# Patient Record
Sex: Female | Born: 1970 | Race: White | Hispanic: No | Marital: Married | Smoking: Never smoker
Health system: Southern US, Community
[De-identification: ages and names within clinical notes are randomized; demographics above are authoritative.]

---

## 2004-10-08 ENCOUNTER — Emergency Department: Payer: Self-pay | Admitting: Emergency Medicine

## 2006-03-23 ENCOUNTER — Inpatient Hospital Stay (HOSPITAL_COMMUNITY): Admission: RE | Admit: 2006-03-23 | Discharge: 2006-03-25 | Payer: Self-pay | Admitting: Obstetrics and Gynecology

## 2010-05-28 ENCOUNTER — Other Ambulatory Visit: Admission: RE | Admit: 2010-05-28 | Discharge: 2010-05-28 | Payer: Self-pay | Admitting: Obstetrics and Gynecology

## 2010-08-22 ENCOUNTER — Encounter: Payer: Self-pay | Admitting: Obstetrics & Gynecology

## 2010-11-24 ENCOUNTER — Inpatient Hospital Stay (HOSPITAL_COMMUNITY)
Admission: RE | Admit: 2010-11-24 | Discharge: 2010-11-26 | DRG: 765 | Disposition: A | Discharge status: Home / Self Care | Payer: Medicaid Other | Source: Ambulatory Visit | Attending: Obstetrics and Gynecology | Admitting: Obstetrics and Gynecology

## 2010-11-24 ENCOUNTER — Other Ambulatory Visit: Payer: Self-pay | Admitting: Obstetrics and Gynecology

## 2010-11-24 ENCOUNTER — Inpatient Hospital Stay (HOSPITAL_COMMUNITY)
Admission: RE | Admit: 2010-11-24 | Payer: Medicaid Other | Source: Ambulatory Visit | Admitting: Obstetrics and Gynecology

## 2010-11-24 DIAGNOSIS — IMO0002 Reserved for concepts with insufficient information to code with codable children: Secondary | ICD-10-CM

## 2010-11-24 DIAGNOSIS — O09529 Supervision of elderly multigravida, unspecified trimester: Secondary | ICD-10-CM

## 2010-11-24 DIAGNOSIS — O99892 Other specified diseases and conditions complicating childbirth: Secondary | ICD-10-CM | POA: Diagnosis present

## 2010-11-24 DIAGNOSIS — N9489 Other specified conditions associated with female genital organs and menstrual cycle: Secondary | ICD-10-CM | POA: Diagnosis present

## 2010-11-24 DIAGNOSIS — Z302 Encounter for sterilization: Secondary | ICD-10-CM

## 2010-11-24 DIAGNOSIS — O0940 Supervision of pregnancy with grand multiparity, unspecified trimester: Secondary | ICD-10-CM

## 2010-11-24 DIAGNOSIS — O34219 Maternal care for unspecified type scar from previous cesarean delivery: Secondary | ICD-10-CM

## 2010-11-24 LAB — URINE MICROSCOPIC-ADD ON

## 2010-11-24 LAB — CBC
MCH: 32.4 pg (ref 26.0–34.0)
MCHC: 34.5 g/dL (ref 30.0–36.0)
RBC: 3.86 MIL/uL — ABNORMAL LOW (ref 3.87–5.11)
WBC: 13.1 10*3/uL — ABNORMAL HIGH (ref 4.0–10.5)

## 2010-11-24 LAB — URINALYSIS, ROUTINE W REFLEX MICROSCOPIC
Bilirubin Urine: NEGATIVE
Glucose, UA: NEGATIVE mg/dL
Ketones, ur: 40 mg/dL — AB
Nitrite: NEGATIVE
Specific Gravity, Urine: 1.02 (ref 1.005–1.030)
pH: 7 (ref 5.0–8.0)

## 2010-11-24 LAB — SURGICAL PCR SCREEN
MRSA, PCR: NEGATIVE
Staphylococcus aureus: NEGATIVE

## 2010-11-25 LAB — CBC
HCT: 35.5 % — ABNORMAL LOW (ref 36.0–46.0)
MCHC: 33.2 g/dL (ref 30.0–36.0)
MCV: 94.4 fL (ref 78.0–100.0)
RDW: 13.7 % (ref 11.5–15.5)

## 2010-11-28 NOTE — H&P (Signed)
  NAMECHENEE, Jacqueline Hanson               ACCOUNT NO.:  [id number]  MEDICAL RECORD NO.:  [id number]           PATIENT TYPE:  I  LOCATION:  910A                          FACILITY:  [facility]  PHYSICIAN:  Tilda Burrow, M.D. DATE OF BIRTH:  Jun 25, 1971  DATE OF ADMISSION: DATE OF DISCHARGE:                             HISTORY & PHYSICAL   ADMISSION DIAGNOSES:  Pregnancy [redacted] weeks gestation, preeclampsia, desire for elective sterilization.  Prior cesarean section, not for trial of labor, history of fourth-degree laceration with first two vaginal deliveries with C-section third baby.  HISTORY OF PRESENT ILLNESS:  This 40 year old female gravida 5, para 3-0- 1-3 with two vaginal deliveries, both which were first-degree lacerations and subsequent primary cesarean section in 2007 is admitted at this time for repeat cesarean section and tubal ligation due to development of preeclampsia.  [name] has been followed through our office on October 16, 2010 for prenatal visits.  Initial blood pressures were 120-140/80, blood pressures was low as 68 and 70 diastolic had been noted during the second trimester.  Blood pressures began to creep up at 34 weeks, blood pressure was 158/90.  Subsequently at 35 weeks is 160/84 and then at 36 weeks was 188/90 with normal liver function tests, rechecked at 150/90 after resting.  As 24-hour urine protein was 222 mg per day and then one repeated on November 22, 2010 when it was 180 mg per day.  Platelets were good at 266,000.  Blood pressures earlier this week 150/92 and then today, she comes in with a day and half persistent dull headache, which is atypical for the patient.  She has no right upper quadrant pain.  Nonstress test on November 22, 2010 was reactive.  She is admitted at this time for repeat cesarean section, tubal ligation due to symptoms elevating the diagnosis of preeclampsia, with her now at 37 weeks.  PAST MEDICAL HISTORY:  Benign surgical history of cesarean  section.  ALLERGIES:  PENICILLIN.MEDICATIONS:  As a child, she is able to take Keflex and cephalosporins without difficulty.  Soc HX: She was divorced, , has recently remarried.  She is a self-employed as a Engineer, civil (consulting).  FAMILY HISTORY:  Positive for hypertension, breast cancer in her mother.  PHYSICAL EXAM:  GENERAL:  Reveals a generally uncomfortable appearing Caucasian female with weight 180.4, up to 1-1.5 pounds in the last 2 days. VITAL SIGNS:  Blood pressure 146/86. HEENT:  Within normal limits. ABDOMEN:  Otherwise no right upper quadrant pain.  Fundal height 38 cm. Vertex presentation confirms reflexes 3+. PELVIC:  Deferred.  LABORATORY DATA:  Two days ago included normal liver enzymes and platelets of 266,000, AST 24, ALT 16.  PLAN:  Admit for repeat cesarean section and tubal ligation at 5 p.m. today.  The patient's last followed with cereal at 8:30 a.m.     Tilda Burrow, M.D.     JVF/MEDQ  D:  11/24/2010  T:  11/24/2010  Job:  [id number]  cc:   [facility]  Electronically Signed by Christin Bach M.D. on 11/28/2010 12:01:43 PM

## 2010-11-28 NOTE — Op Note (Addendum)
Jacqueline Hanson, Jacqueline Hanson               ACCOUNT NO.:  [id number]  MEDICAL RECORD NO.:  [id number]           PATIENT TYPE:  I  LOCATION:  9372                          [facility]:  [facility]  PHYSICIAN:  Tilda Burrow, M.D. DATE OF BIRTH:  07/14/1971  DATE OF PROCEDURE:  11/24/2010 DATE OF DISCHARGE:                              OPERATIVE REPORT   PREOPERATIVE DIAGNOSES: 1. Intrauterine pregnancy at 37 weeks and 0 days. 2. Previous cesarean section. 3. Multiparity. 4. Gestational hypertension with superimposed preeclampsia. 5. Vulvar lesion.  POSTOPERATIVE DIAGNOSES: 1. Intrauterine pregnancy at 37 weeks and 0 days. 2. Previous cesarean section. 3. Multiparity. 4. Gestational hypertension with superimposed preeclampsia. 5. Vulvar lesion.  PROCEDURE:  Repeat low transverse cesarean section with bilateral tubal ligation with Filshie clips and excision of vulvar mass.  SURGEON:  Dr. Emelda Fear and Dr. Orvan Falconer.  ANESTHESIA:  Spinal.  FINDINGS:  A viable female infant in vertex presentation with clear amniotic fluid, vacuum-assisted delivery.  Apgars were 9 and 9 , weight was 7 pounds and 4 ounces.  Normal uterus and adnexa bilaterally.  IV FLUIDS:  2 L.  URINE OUTPUT:  200 mL of clear urine at the end of procedure.  ESTIMATED BLOOD LOSS:  800 mL.  SPECIMENS:  Placenta and a vulvar lesion, both were sent to Pathology.  Complications were none immediate.  INDICATIONS:  This is a 40 year old gravida 4, para 3-0-0-3 with intrauterine pregnancy at 37 weeks and 0 days with history of previous C- section and gestational hypertension who presented to clinic with worsening blood pressures, headaches that were persistent and worsening lower extremity edema.  The concern was for superimposed preeclampsia and the patient was scheduled for a repeat C-section with bilateral tubal ligation.  The patient was not in labor at the time of admission. Her GBS status is unknown.  Remaining prenatal  labs were within normal limits.  The patient was on labetalol 100 b.i.d.  Blood pressure on admission 149/81.  Risks, benefits and alternatives were discussed with the patient and she decided to proceed with a repeat C-section with bilateral tubal ligation.  PROCEDURE IN DETAIL:  After informed consent was obtained, the patient was taken back to the operative suite where spinal anesthesia was placed.  After anesthesia was found to be adequate, the patient was placed in dorsal supine position with leftward tilt.  The patient was prepped and draped in normal sterile fashion.  A Pfannenstiel skin incision was then made with a scalpel and carried down through to the underlying fascia.  The fascia was then incised in the midline.  The fascial incision was then extended laterally with the Mayo scissors. The superior aspect of the fascial incision was then grasped with Kocher clamps, elevated up and the rectus muscle was then dissected off bluntly and sharply with the Mayo's due to previous adhesions and scarring. Attention was then turned inferiorly which in similar fashion was grasped with Kocher clamps, elevated up and the rectus muscles were then dissected off bluntly and sharply with Mayo due to some previous adhesions and scars.  The rectus muscles were then separated in the midline using the  electrocautery again due to some previous adhesions and scar tissue.  The peritoneum was then entered bluntly and the incision was then extended manually.  Additionally upon entry into the peritoneal cavity, some additional lateral room was then made with the electrocautery.  Bladder blade was then inserted.  The lower uterine segment was incised in a transverse fashion with the scalpel.  Amniotomy was performed notable with clear fluid and was noted to be in vertex presentation.  Infant was delivered with Kiwi vacuum assistance x1 pull with no pop offs and was delivered otherwise atraumatically.   Mouth and nose were bulb suctioned.  Cord was cut and clamped.  Infant was handed off to awaiting NICU.  Apgars were 9 and 9, weight was 7 pounds and 4 ounces.  Cord blood was sampled.  Placenta was then delivered spontaneously intact with three-vessel cord.  Placenta was then sent to Pathology.  The uterus was then cleared of all clots and debris.  The uterine incision was then repaired using 0 chromic in a running locking fashion.  The uterine incision was found to be hemostatic.  Attention was then turned to bilateral adnexa in which time the left adnexa was visualized.  The left fallopian tube was followed out to the fimbriae, grasped with Babcock clamp and the Filshie clip was then deployed in the isthmic region.  The adnexa was then returned to the peritoneal cavity and in similar fashion the right adnexa was then visualized.  The left fallopian tube was grasped with a Babcock clamp, followed out to the fimbriae and the Filshie clip was then deployed in the isthmic region and that was also returned to the abdomen.  The uterine incision was then revisualized, was found to be hemostatic.  The peritoneum was thenclosed using a 2-0 Vicryl in a running fashion, one interrupted stitch using the same 2-0 Vicryl was used to reapproximate the rectus muscles. The fascial incision was then closed using 0 Vicryl in a running fashion.  Subcutaneous tissue was then irrigated, found to be hemostatic.  Interrupted sutures were then used to reapproximate the subcutaneous tissue using a 2-0 Vicryl.  The skin was then closed using a 4-0 Vicryl in a subcuticular fashion.  Lap, needle and sponge counts were correct x2.  The patient did receive antibiotics perioperatively and the patient was taken back to the recovery area in stable condition.    ______________________________ Maryelizabeth Kaufmann, MD   ______________________________ Tilda Burrow, M.D.    LC/MEDQ  D:  11/24/2010  T:  11/25/2010   Job:  [id number]  Electronically Signed by Christin Bach M.D. on 11/28/2010 12:02:10 PM Electronically Signed by Maryelizabeth Kaufmann MD on 11/29/2010 02:02:52 PM

## 2010-12-01 ENCOUNTER — Other Ambulatory Visit (HOSPITAL_COMMUNITY): Payer: Medicaid Other

## 2010-12-10 NOTE — Discharge Summary (Signed)
  [name], [name]               ACCOUNT NO.:  [id number]  MEDICAL RECORD NO.:  [id number]           PATIENT TYPE:  I  LOCATION:  9142                          [facility]ILITY:  [facility]  PHYSICIAN:  Gordana Kewley S. Shawnie Pons, M.D.   DATE OF BIRTH:  11-07-1970  DATE OF ADMISSION:  11/24/2010 DATE OF DISCHARGE:  11/26/2010                              DISCHARGE SUMMARY   REASON FOR HOSPITALIZATION:  Repeat lower transverse C-section with bilateral tubal ligation for gestational hypertension with preeclampsia.  DISCHARGE DIAGNOSIS:  Repeat lower transverse C-section with bilateral tubal ligation for gestational hypertension with preeclampsia.  DISCHARGE MEDICATIONS: 1. Labetalol 100 mg p.o. b.i.d. x2 weeks, #30. 2. Percocet 5/325 mg 1 p.o. q.6 h. p.r.n. pain, #20. 3. Motrin 600 mg p.o. q.6 h. p.r.n. mild pain, #30. 4. Colace 100 mg p.o. b.i.d. for prevention of constipation, #60.  PROCEDURES:  Repeat lower transverse C-section with bilateral tubal ligation.  Please see the transcription for the procedure regarding the details.  The patient had a viable female in the vertex position and the procedure was vacuum assisted, Apgars 9 and 9.  Weight 7 pounds and 4 ounces. Estimated blood loss 800 mL.  No complications during the delivery. Postoperatively, the patient did well.  The patient was kept on magnesium for 24 hours postoperatively.  The patient was asymptomatic on magnesium.  At the time of discharge, the patient's blood pressures were normal in the 110s-120s/80s.  Her hemoglobin postoperatively was 11.8. The patient had a bowel movements before discharge, was urinating without problems, and was tolerating her diet.  The patient was asked to continue her current blood pressure medicine that she had been taking postoperatively which is labetalol 100 mg t.i.d.  DISCHARGE INSTRUCTIONS:  The patient was asked to resume a regular diet, increase activity as tolerated, and regarding sexual activity,  pelvic rest x6 weeks.  The patient was asked to follow up at [facility] where she received her prenatal care in 2 weeks for reassessment of her blood pressure at that time.  In the meantime, the patient was asked to take her current dose of labetalol.  The patient and her infant were discharged home in stable medical condition.  The patient was bottle- feeding and would like an outpatient circumcision for her infant.    ______________________________ Priscella Mann, MD   ______________________________ Shelbie Proctor. Shawnie Pons, M.D.    AO/MEDQ  D:  11/26/2010  T:  11/26/2010  Job:  [id number]  Electronically Signed by Priscella Mann MD on 12/04/2010 10:51:59 AM Electronically Signed by Tinnie Gens M.D. on 12/10/2010 01:35:09 PM

## 2010-12-17 NOTE — Discharge Summary (Signed)
NAMEMELANNY, WIRE                ACCOUNT NO.:  [id number]   MEDICAL RECORD NO.:  [id number]          PATIENT TYPE:  INP   LOCATION:  A409                          FACILITY:  [facility]   PHYSICIAN:  Lazaro Arms, M.D.   DATE OF BIRTH:  Dec 29, 1970   DATE OF ADMISSION:  03/23/2006  DATE OF DISCHARGE:  08/25/2007LH                                 DISCHARGE SUMMARY   DISCHARGE DIAGNOSES:  1. Status post primary cesarean section.  2. Unremarkable postoperative course.   OPERATION PERFORMED:  Primary cesarean section.   NOTE:  Please refer to the history and physical, antepartum chart and  operative report for details of admission to the hospital.   HOSPITAL COURSE:  The patient was admitted postoperatively.  She had an  unremarkable course.  She was tolerating clear liquids and a regular diet.  She voided without symptoms.  The patient was extensively ambulatory.  Her  postop day number one hemoglobin and hematocrit were 12.2 and 34.6  respectively.  Her incision was clean, dry and intact on the morning of  discharge.  She was using very little pain medicine.   DISPOSITION:  The patient was discharged to home on the morning of  postoperative day number two in good and stable condition.   FOLLOW UP:  The patient is to follow up in the office in three days to have  her incision evaluated and staples removed.   MEDICATIONS:  The patient as given a prescription for Tylox #30 and Motrin  #20 for pain.   DISCHARGE INSTRUCTIONS:  The patient was given instructions and precautions  for contacting our office prior to that time (time of follow up).      Lazaro Arms, M.D.  Electronically Signed     LHE/MEDQ  D:  03/25/2006  T:  03/25/2006  Job:  [id number]

## 2010-12-17 NOTE — H&P (Signed)
Jacqueline Hanson, Jacqueline Hanson                ACCOUNT NO.:  [id number]   MEDICAL RECORD NO.:  [id number]          PATIENT TYPE:  AMB   LOCATION:  DAY                           FACILITY:  [facility]   PHYSICIAN:  Tilda Burrow, M.D. DATE OF BIRTH:  06/16/1971   DATE OF ADMISSION:  DATE OF DISCHARGE:  LH                                HISTORY & PHYSICAL   ADMITTING DIAGNOSES:  1. Pregnancy at [redacted] weeks gestation.  2. Elective primary cesarean section due to history of fourth degree      laceration of the perineum with fear of recurrence.   HISTORY OF PRESENT ILLNESS:  This 40 year old female gravida 3, para 2, AB  0.  Last LMP November 30 placed the [facility] April 06, 2006.  Is admitted for  primary cesarean section.  Mackinzee's pregnancy has been notable for an  uneventful course through eight prenatal visits with late initial prenatal  visit.  Ultrasound suggested EDC as March 25, 2006, suggesting a slightly  larger than average infant for gestational age.  Sebastiana's obstetric  experiences have been affected by a deep laceration with the first infant  which was repaired easily, and then a deep third degree or a partial fourth  degree laceration occurring with the 1994 delivery.  The patient is now at  this time interested in primary cesarean section to avoid further perineal  damage.   PAST MEDICAL HISTORY:  Benign.   SURGICAL HISTORY:  Fourth degree repair.   ALLERGIES:  PENICILLIN.   PHYSICAL EXAMINATION:  GENERAL:  She is in overall good general health,  alert, oriented x3.  HEENT:  Pupils equal, round, and reactive.  NECK:  Supple.  CARDIOVASCULAR:  Unremarkable.  ABDOMEN:  Nontender.  EXTREMITIES AND GENITALIA:  Normal.  Vertex presenting part.   PRENATAL LABORATORY DATA:  Blood type A positive.  Urine drug screen  negative.  Rubella immunity at 2.0.  Hemoglobin 11, hematocrit 34.  Hepatitis, HIV nonreactive.  Term-size fetus, vertex presentation noted on  abdominal exam.   PLAN:   Primary cesarean section, March 23, 2006.      Tilda Burrow, M.D.  Electronically Signed     JVF/MEDQ  D:  03/22/2006  T:  03/22/2006  Job:  [id number]   cc:   Francoise Schaumann. Milford Cage DO, FAAP  Fax: [phone]

## 2010-12-17 NOTE — Op Note (Signed)
[name], Jacqueline Hanson                ACCOUNT NO.:  [id number]   MEDICAL RECORD NO.:  [id number]          PATIENT TYPE:  INP   LOCATION:  A409                          FACILITY:  [facility]   PHYSICIAN:  Tilda Burrow, M.D. DATE OF BIRTH:  Dec 28, 1970   DATE OF PROCEDURE:  03/23/2006  DATE OF DISCHARGE:                                 OPERATIVE REPORT   PREOPERATIVE DIAGNOSES:  1. Pregnancy at [redacted] weeks gestation.  2. Elective primary cesarean section due to history of fourth-degree      laceration with difficult repair.   POSTOPERATIVE DIAGNOSES:  1. Pregnancy at [redacted] weeks gestation.  2. Elective primary cesarean section due to history of fourth-degree      laceration with difficult repair.   PROCEDURE:  Primary low transverse cervical cesarean section.   SURGEON:  Tilda Burrow, M.D.   ASSISTANT:  None.   ANESTHESIA:  Spinal.  Nelda Severe, C.R.N.A.   COMPLICATIONS:  None.   FINDINGS:  Healthy female infant, Apgars 9 and 9.  Scanty bleeding, less than  400 cc.   INDICATIONS FOR PROCEDURE:  The patient is a 40 year old who had a severe  fourth-degree laceration with first infant and significant tearing with the  second one, not willing to risk repeat tearing and slow healing.   DESCRIPTION OF PROCEDURE:  The patient was taken to the operating room and  prepped and draped in the usual fashion for lower abdominal surgery after  the Foley catheter was inserted and spinal anesthesia in place.   A Pfannenstiel-type incision was performed.  The bladder flap was developed  on the lower uterine segment.  A transverse uterine incision was carefully  made down to the infant and extended laterally using index finger traction.  Fetal vertex rotated into the incision, expelled easily, and delivered by  fundal pressure.  The cord was clamped.  The infant was passed to the  awaiting pediatrician, Dr. Francoise Schaumann. Halm.  See his notes for further  details on the infant.  Cord blood gases were obtained  and are noted  elsewhere.  The placenta delivered intact, Tomasa Blase presentation, with three-  vessel cord confirmed.  The patient then had uterine irrigation with  antibiotic-containing solution, single-layer running locking closure of the  uterus, 2-0 chromic closure of the bladder flap, irrigation of the abdomen  with  antibiotic solution, and then closure of the anterior peritoneum with 2-0  chromic, closure of the fascia with 0 Vicryl, and subcutaneous 2-0 plain  closure of the fatty tissue potential space.  Staple closure of the skin  then completed the procedure.  Estimated blood loss was 400 cc.      Tilda Burrow, M.D.  Electronically Signed     JVF/MEDQ  D:  03/24/2006  T:  03/24/2006  Job:  [id number]   cc:   Francoise Schaumann. Raynelle Highland  Fax: [phone]   [facility]

## 2014-06-07 ENCOUNTER — Emergency Department (HOSPITAL_COMMUNITY): Payer: Self-pay

## 2014-06-07 ENCOUNTER — Encounter (HOSPITAL_COMMUNITY): Payer: Self-pay | Admitting: *Deleted

## 2014-06-07 ENCOUNTER — Emergency Department (HOSPITAL_COMMUNITY)
Admission: EM | Admit: 2014-06-07 | Discharge: 2014-06-07 | Disposition: A | Discharge status: Home / Self Care | Payer: Self-pay | Attending: Emergency Medicine | Admitting: Emergency Medicine

## 2014-06-07 DIAGNOSIS — S62609A Fracture of unspecified phalanx of unspecified finger, initial encounter for closed fracture: Secondary | ICD-10-CM

## 2014-06-07 DIAGNOSIS — X58XXXA Exposure to other specified factors, initial encounter: Secondary | ICD-10-CM | POA: Insufficient documentation

## 2014-06-07 DIAGNOSIS — Y998 Other external cause status: Secondary | ICD-10-CM | POA: Insufficient documentation

## 2014-06-07 DIAGNOSIS — Y9389 Activity, other specified: Secondary | ICD-10-CM | POA: Insufficient documentation

## 2014-06-07 DIAGNOSIS — S62627A Displaced fracture of medial phalanx of left little finger, initial encounter for closed fracture: Secondary | ICD-10-CM | POA: Insufficient documentation

## 2014-06-07 DIAGNOSIS — R52 Pain, unspecified: Secondary | ICD-10-CM

## 2014-06-07 DIAGNOSIS — Z791 Long term (current) use of non-steroidal anti-inflammatories (NSAID): Secondary | ICD-10-CM | POA: Insufficient documentation

## 2014-06-07 DIAGNOSIS — Y9289 Other specified places as the place of occurrence of the external cause: Secondary | ICD-10-CM | POA: Insufficient documentation

## 2014-06-07 MED ORDER — HYDROCODONE-ACETAMINOPHEN 5-325 MG PO TABS
1.0000 | ORAL_TABLET | Freq: Once | ORAL | Status: AC
  Administered 2014-06-07: 1 via ORAL

## 2014-06-07 MED ORDER — NAPROXEN 500 MG PO TABS: 500.0000 mg | ORAL_TABLET | Freq: Two times a day (BID) | ORAL | Status: AC

## 2014-06-07 MED ORDER — HYDROCODONE-ACETAMINOPHEN 5-325 MG PO TABS: ORAL_TABLET | ORAL | Status: AC

## 2014-06-07 MED ORDER — HYDROCODONE-ACETAMINOPHEN 5-325 MG PO TABS
ORAL_TABLET | ORAL | Status: AC
  Filled 2014-06-07: qty 1

## 2014-06-07 MED ORDER — OXYCODONE-ACETAMINOPHEN 5-325 MG PO TABS
1.0000 | ORAL_TABLET | Freq: Once | ORAL | Status: DC
  Filled 2014-06-07: qty 1

## 2014-06-07 NOTE — ED Notes (Signed)
Pt had her left little finger get twisted in a blanket while playing with her son about 2 hours ago, pt unable to straighten finger out, bruising and swelling noted, ice pack given to pt

## 2014-06-07 NOTE — ED Provider Notes (Signed)
CSN: [id number]     Arrival date & time 06/07/14  1120 History   First MD Initiated Contact with Patient 06/07/14 1150     Chief Complaint  Patient presents with  . Finger Injury     (Consider location/radiation/quality/duration/timing/severity/associated sxs/prior Treatment) HPI   Jacqueline Hanson is a 43 y.o. female who presents to the Emergency Department complaining of left fifth finger pain and swelling.  She reports a pulling and twisting injury to the finger several hrs PTA.  She reports immediate swelling to her finger with inability to move or extend the finger without significant pain.  She denies any treatments prior to ED arrival.  Pain is also worse with palpation of the finger.  She denies numbness of the finger or nail injury.     History reviewed. No pertinent past medical history. History reviewed. No pertinent past surgical history. No family history on file. History  Substance Use Topics  . Smoking status: Never Smoker   . Smokeless tobacco: Not on file  . Alcohol Use: No   OB History    No data available     Review of Systems  Constitutional: Negative for fever and chills.  Genitourinary: Negative for dysuria and difficulty urinating.  Musculoskeletal: Positive for joint swelling and arthralgias.  Skin: Negative for color change and wound.  All other systems reviewed and are negative.     Allergies  Review of patient's allergies indicates no known allergies.  Home Medications   Prior to Admission medications   Medication Sig Start Date End Date Taking? Authorizing Provider  HYDROcodone-acetaminophen (NORCO/VICODIN) 5-325 MG per tablet Take one-two tabs po q 4-6 hrs prn pain 06/07/14   Branda Chaudhary L. Donovin Kraemer, PA-C  naproxen (NAPROSYN) 500 MG tablet Take 1 tablet (500 mg total) by mouth 2 (two) times daily with a meal. 06/07/14   Gaylon Bentz L. Ryleeann Urquiza, PA-C   BP 138/100 mmHg  Pulse 88  Temp(Src) 97.7 F (36.5 C) (Oral)  Resp 24  Ht 5\' 5"  (1.651 m)  Wt 140  lb (63.504 kg)  BMI 23.30 kg/m2  SpO2 100% Physical Exam  Constitutional: She is oriented to person, place, and time. She appears well-developed and well-nourished. No distress.  HENT:  Head: Normocephalic and atraumatic.  Cardiovascular: Normal rate, regular rhythm, normal heart sounds and intact distal pulses.   No murmur heard. Pulmonary/Chest: Effort normal and breath sounds normal. No respiratory distress.  Musculoskeletal: She exhibits edema and tenderness.  Localized ttp and edema of the distal left fifth finger from the PIP joint to the tip of the finger.  Pt grimacing with slightest touch of the finger.  Radial pulse is brisk, distal sensation intact.  CR< 2 sec. No proximal tenderness    Neurological: She is alert and oriented to person, place, and time. She exhibits normal muscle tone. Coordination normal.  Skin: Skin is warm and dry.  Nursing note and vitals reviewed.   ED Course  Procedures (including critical care time) Labs Review Labs Reviewed - No data to display  Imaging Review Dg Finger Little Left  06/07/2014   CLINICAL DATA:  Pain, bruising, decreased range of motion of the left fifth digit  EXAM: LEFT LITTLE FINGER 2+V  COMPARISON:  None.  FINDINGS: Visualization is suboptimal due to flexion at the DIP joint, but on several images there is suggestion of linear vertical cortical and trabecular disruption at the head of the middle phalanx of the left fifth digit. No radiopaque foreign body or soft tissue abnormality. No  dislocation.  IMPRESSION: Suspected vertical oblique fracture at the head of the middle phalanx of the left fifth digit.   Electronically Signed   By: Christiana PellantGretchen  Green M.D.   On: 06/07/2014 12:00     EKG Interpretation None      MDM   Final diagnoses:  Finger fracture, closed, initial encounter    Finger splinted, pain improved, remains NV intact.     Pt is hypertensive, she denies any symptoms other than finger pain at this time, patient  reports history of previous hypertension during pregnancy secondary to preeclampsia. She reports that her blood pressure becomes elevated related to anxiety or pain, she states that she checks her blood pressure regularly and had pressures have been normal at home. Advised her to continue to monitor her blood pressure and to return to the ER for any worsening symptoms    Dayvin Aber L. Trisha Mangleriplett, PA-C 06/08/14 2100  Rolland PorterMark James, MD 06/20/14 [phone]

## 2014-06-07 NOTE — ED Notes (Signed)
Pt states she pulled a blanket that was wrapped around her hand and pulled her left pinkie finger. Finger is swollen and pt is unable to move finger

## 2014-06-07 NOTE — Discharge Instructions (Signed)
Finger Fracture °A finger fracture is when one or more bones in the finger break.  °HOME CARE  °· Wear the splint, tape, or cast as long as told by your doctor. °· Keep your fingers in the position your doctor tell you to. °· Raise (elevate) the injured area above the level of the heart. °· Only take medicine as told by your doctor. °· Put ice on the injured area. °¨ Put ice in a plastic bag. °¨ Place a towel between the skin and the bag. °¨ Leave the ice on for 15-20 minutes, 03-04 times a day. °· Follow up with your doctor. °· Ask what exercises you can do when the splint comes off. °GET HELP RIGHT AWAY IF:  °· The fingernails are white or bluish. °· You have pain not helped by medicine. °· You cannot move your fingertips. °· You lose feeling (numbness) in the injured finger(s). °MAKE SURE YOU:  °· Understand these instructions. °· Will watch this condition. °· Will get help right away if you are not doing well or get worse. °Document Released: 01/04/2008 Document Revised: 10/10/2011 Document Reviewed: 01/04/2008 °[employer]® Patient Information ©2015 [employer]. This information is not intended to replace advice given to you by your health care provider. Make sure you discuss any questions you have with your health care provider. ° °

## 2015-11-23 IMAGING — CR DG FINGER LITTLE 2+V*L*
2 series · 2 of 2 positions shown · non-contrast
Comparison: None.

CLINICAL DATA: Pain, bruising, decreased range of motion of the
left fifth digit

EXAM:
LEFT LITTLE FINGER 2+V

[view not recorded (1 of 2)]
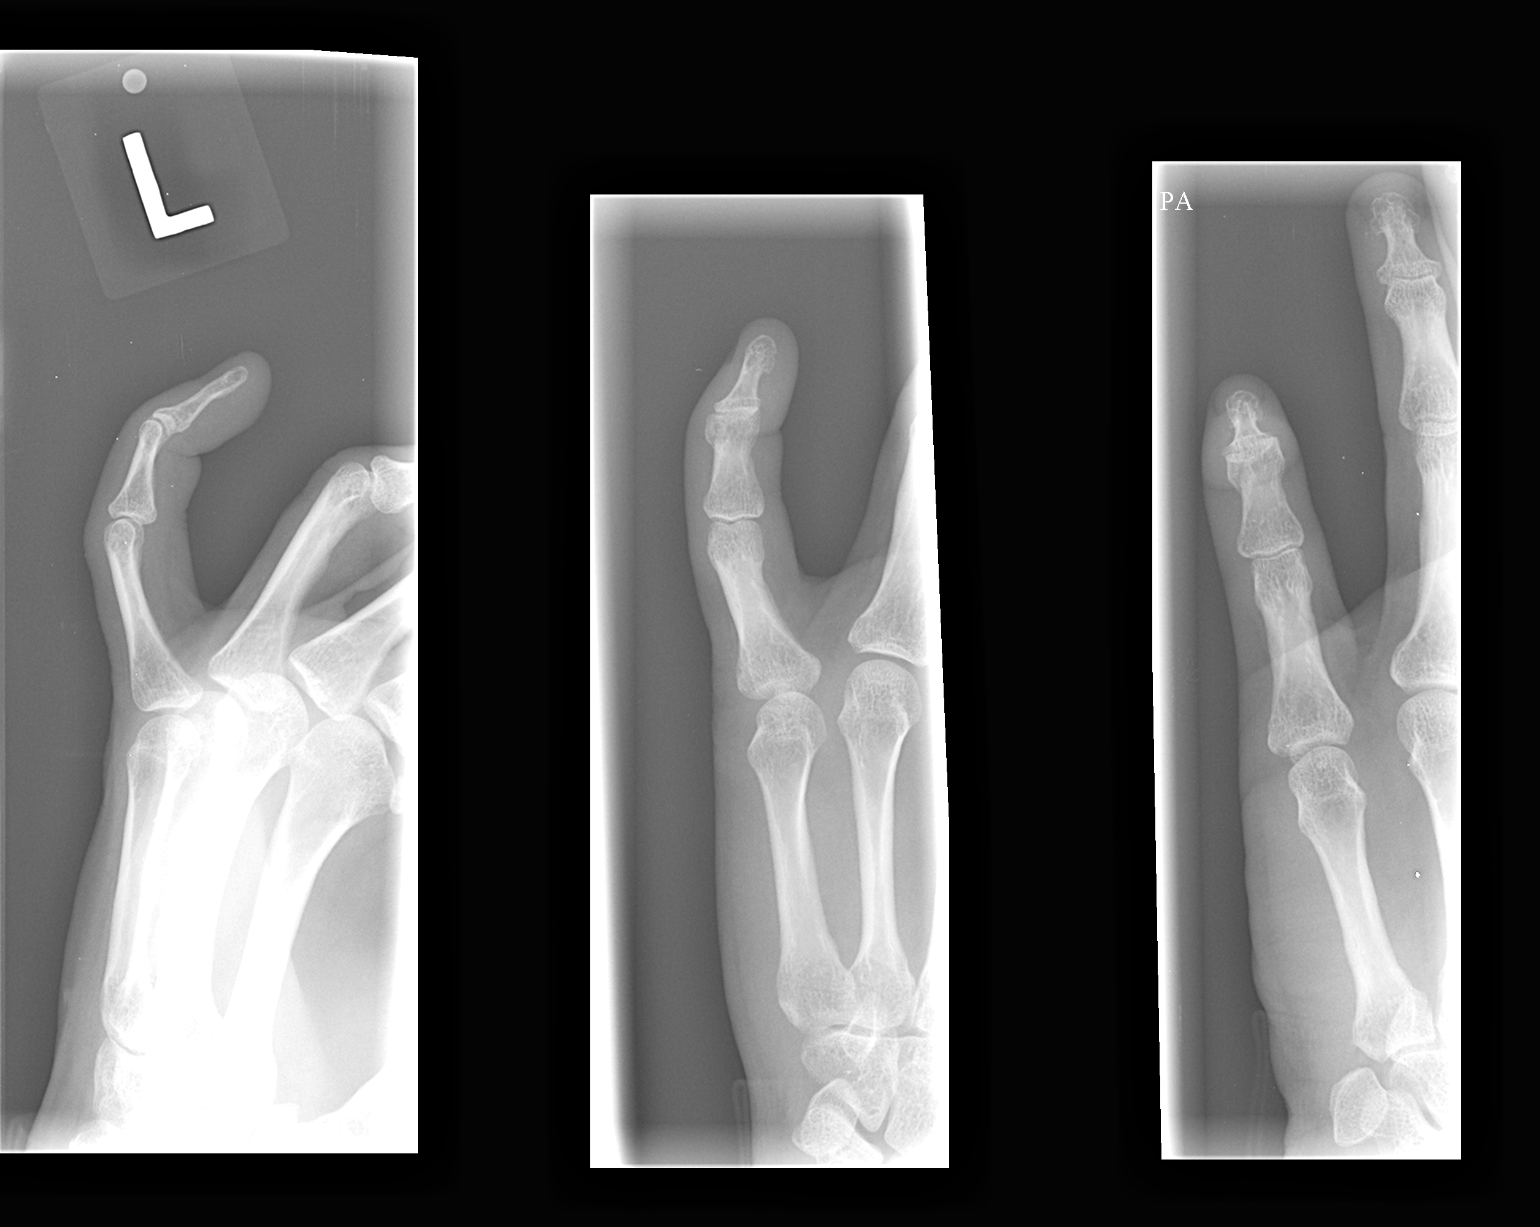

[view not recorded (2 of 2)]
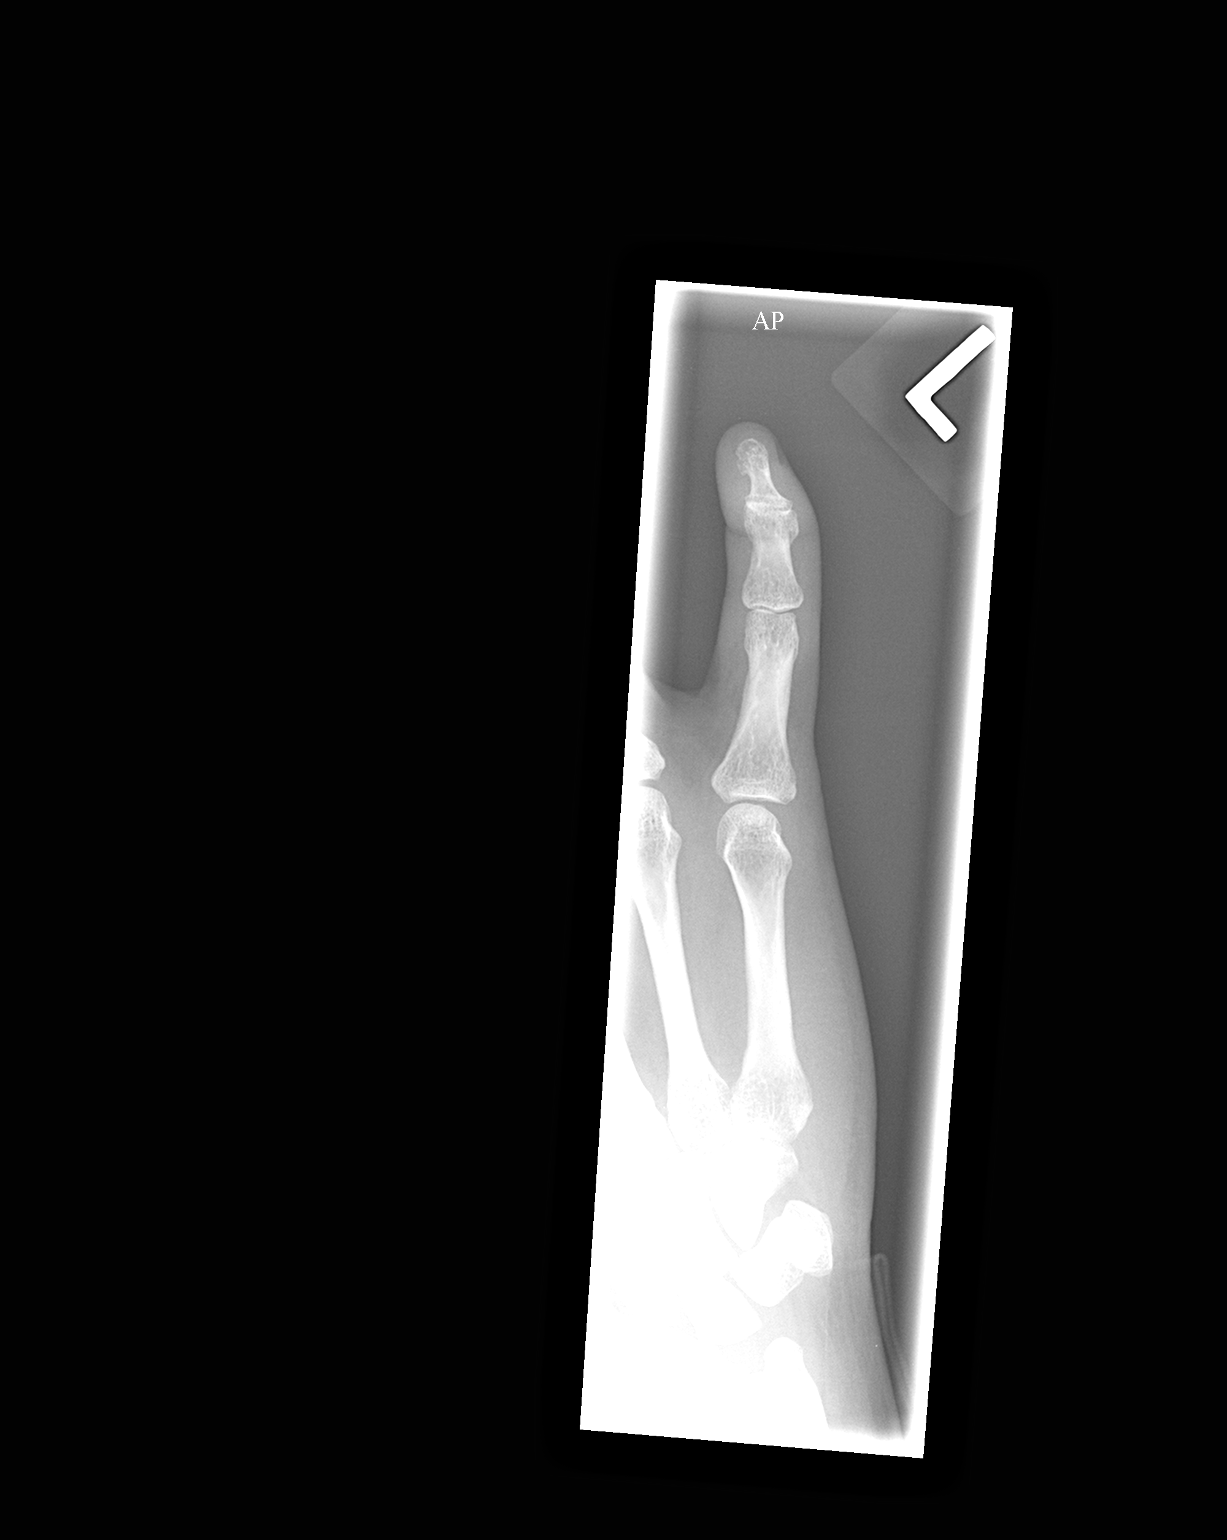

[2 of 2 positions shown; findings below may reference images not displayed]

FINDINGS: Visualization is suboptimal due to flexion at the DIP joint, but on
several images there is suggestion of linear vertical cortical and
trabecular disruption at the head of the middle phalanx of the left
fifth digit. No radiopaque foreign body or soft tissue abnormality.
No dislocation.
IMPRESSION: Suspected vertical oblique fracture at the head of the middle
phalanx of the left fifth digit.

## 2019-08-28 ENCOUNTER — Ambulatory Visit: Payer: BC Managed Care – PPO | Attending: Internal Medicine

## 2019-08-28 ENCOUNTER — Other Ambulatory Visit: Payer: Self-pay

## 2019-08-28 DIAGNOSIS — Z20822 Contact with and (suspected) exposure to covid-19: Secondary | ICD-10-CM

## 2019-08-29 LAB — NOVEL CORONAVIRUS, NAA: SARS-CoV-2, NAA: NOT DETECTED

## 2019-09-29 ENCOUNTER — Ambulatory Visit: Payer: Self-pay | Attending: Internal Medicine

## 2019-09-29 ENCOUNTER — Other Ambulatory Visit: Payer: Self-pay

## 2019-09-29 DIAGNOSIS — Z23 Encounter for immunization: Secondary | ICD-10-CM | POA: Insufficient documentation

## 2019-09-29 NOTE — Progress Notes (Signed)
   C[facility] Vaccination [facility]  Name:  CEOLA PARA    MRN: [id number] DOB: 22-May-1971  09/29/2019  Ms. Griffith was observed post Covid-19 immunization for 15 minutes without incidence. She was provided with Vaccine Information Sheet and instruction to access the V-Safe system.   Ms. Hitson was instructed to call 911 with any severe reactions post vaccine: Marland Kitchen Difficulty breathing  . Swelling of your face and throat  . A fast heartbeat  . A bad rash all over your body  . Dizziness and weakness    Immunizations Administered    Name Date Dose VIS Date Route   Moderna COVID-19 Vaccine 09/29/2019  9:49 AM 0.5 mL 07/02/2019 Intramuscular   Manufacturer: Gala Murdoch   Lot: [id number]   NDC: 67011-003-49

## 2019-11-02 ENCOUNTER — Ambulatory Visit: Payer: Self-pay

## 2021-03-18 ENCOUNTER — Ambulatory Visit
Admission: EM | Admit: 2021-03-18 | Discharge: 2021-03-18 | Disposition: A | Discharge status: Home / Self Care | Payer: BC Managed Care – PPO | Attending: Emergency Medicine | Admitting: Emergency Medicine

## 2021-03-18 ENCOUNTER — Other Ambulatory Visit: Payer: Self-pay

## 2021-03-18 DIAGNOSIS — H9202 Otalgia, left ear: Secondary | ICD-10-CM

## 2021-03-18 MED ORDER — ACETIC ACID 2 % OT SOLN: 4.0000 [drp] | Freq: Three times a day (TID) | OTIC | 0 refills | Status: AC

## 2021-03-18 MED ORDER — PREDNISONE 20 MG PO TABS: 20.0000 mg | ORAL_TABLET | Freq: Two times a day (BID) | ORAL | 0 refills | Status: AC

## 2021-03-18 NOTE — Discharge Instructions (Addendum)
Rest and drink plenty of fluids Prescribed acetic acid ear drops Prednisone prescribed.  Do not take if pregnant or nursing Take medications as directed and to completion Continue to use OTC ibuprofen and/ or tylenol as needed for pain control Follow up with PCP if symptoms persists Return here or go to the ER if you have any new or worsening symptoms

## 2021-03-18 NOTE — ED Provider Notes (Signed)
[facility]   [id number] 03/18/21 Arrival Time: 1819  CC:EAR PAIN  SUBJECTIVE: History from: patient.  Jacqueline Hanson is a 50 y.o. female who presents with of LT ear discomfort x today.  Denies a precipitating event, such as swimming or wearing ear plugs.  Patient states the pain is intermittent and sharp, improved right now.  Patient has tried advil with relief.  Denies aggravating.  Reports similar symptoms in the past with infection.    Denies fever, chills, fatigue, sinus pain, rhinorrhea, ear discharge, sore throat, SOB, wheezing, chest pain, nausea, changes in bowel or bladder habits.    ROS: As per HPI.  All other pertinent ROS negative.     No past medical history on file. No past surgical history on file. No Known Allergies No current facility-administered medications on file prior to encounter.   Current Outpatient Medications on File Prior to Encounter  Medication Sig Dispense Refill   HYDROcodone-acetaminophen (NORCO/VICODIN) 5-325 MG per tablet Take one-two tabs po q 4-6 hrs prn pain 20 tablet 0   naproxen (NAPROSYN) 500 MG tablet Take 1 tablet (500 mg total) by mouth 2 (two) times daily with a meal. 20 tablet 0   Social History   Socioeconomic History   Marital status: Married    Spouse name: Not on file   Number of children: Not on file   Years of education: Not on file   Highest education level: Not on file  Occupational History   Not on file  Tobacco Use   Smoking status: Never   Smokeless tobacco: Not on file  Substance and Sexual Activity   Alcohol use: No   Drug use: No   Sexual activity: Not on file  Other Topics Concern   Not on file  Social History Narrative   Not on file   Social Determinants of Health   Financial Resource Strain: Not on file  Food Insecurity: Not on file  Transportation Needs: Not on file  Physical Activity: Not on file  Stress: Not on file  Social Connections: Not on file  Intimate Partner Violence: Not on  file   No family history on file.  OBJECTIVE:  Vitals:   03/18/21 1933  BP: (!) 162/96  Pulse: 99  Resp: 18  Temp: 98.1 F (36.7 C)  TempSrc: Oral  SpO2: 99%     General appearance: alert; well-appearing, nontoxic; speaking in full sentences and tolerating own secretions HEENT: NCAT; Ears: EACs clear, TMs pearly gray; Eyes: PERRL.  EOM grossly intact.Nose: nares patent without rhinorrhea, Throat: oropharynx clear, tonsils non erythematous or enlarged, uvula midline  Neck: supple without LAD Lungs: unlabored respirations, symmetrical air entry; cough: absent; no respiratory distress; CTAB Heart: regular rate and rhythm.  Skin: warm and dry Psychological: alert and cooperative; normal mood and affect   ASSESSMENT & PLAN:  1. Left ear pain     Meds ordered this encounter  Medications   predniSONE (DELTASONE) 20 MG tablet    Sig: Take 1 tablet (20 mg total) by mouth 2 (two) times daily with a meal for 5 days.    Dispense:  10 tablet    Refill:  0    Order Specific Question:   Supervising Provider    Answer:   Eustace Moore [[id number]]   acetic acid 2 % otic solution    Sig: Place 4 drops into the left ear 3 (three) times daily.    Dispense:  15 mL    Refill:  0  Order Specific Question:   Supervising Provider    Answer:   Eustace Moore [[id number]]    Rest and drink plenty of fluids Prescribed acetic acid ear drops Take medications as directed and to completion Continue to use OTC ibuprofen and/ or tylenol as needed for pain control Follow up with PCP if symptoms persists Return here or go to the ER if you have any new or worsening symptoms   Reviewed expectations re: course of current medical issues. Questions answered. Outlined signs and symptoms indicating need for more acute intervention. Patient verbalized understanding. After Visit Summary given.          Rennis Harding, PA-C 03/18/21 [phone]

## 2021-03-18 NOTE — ED Triage Notes (Signed)
LT ear pain that started today
